# Patient Record
Sex: Male | Born: 1965 | Race: Black or African American | Hispanic: No
Health system: Southern US, Community
[De-identification: ages and names within clinical notes are randomized; demographics above are authoritative.]

## PROBLEM LIST (undated history)

## (undated) DIAGNOSIS — E119 Type 2 diabetes mellitus without complications: Secondary | ICD-10-CM

## (undated) DIAGNOSIS — I10 Essential (primary) hypertension: Secondary | ICD-10-CM

## (undated) DIAGNOSIS — I639 Cerebral infarction, unspecified: Secondary | ICD-10-CM

## (undated) DIAGNOSIS — G629 Polyneuropathy, unspecified: Secondary | ICD-10-CM

---

## 2014-12-28 DIAGNOSIS — I10 Essential (primary) hypertension: Secondary | ICD-10-CM | POA: Insufficient documentation

## 2014-12-28 DIAGNOSIS — G43809 Other migraine, not intractable, without status migrainosus: Secondary | ICD-10-CM | POA: Diagnosis not present

## 2014-12-28 DIAGNOSIS — Z8673 Personal history of transient ischemic attack (TIA), and cerebral infarction without residual deficits: Secondary | ICD-10-CM | POA: Insufficient documentation

## 2014-12-28 DIAGNOSIS — R42 Dizziness and giddiness: Secondary | ICD-10-CM | POA: Insufficient documentation

## 2014-12-28 DIAGNOSIS — E119 Type 2 diabetes mellitus without complications: Secondary | ICD-10-CM | POA: Diagnosis not present

## 2014-12-29 ENCOUNTER — Emergency Department (HOSPITAL_COMMUNITY): Payer: BLUE CROSS/BLUE SHIELD

## 2014-12-29 ENCOUNTER — Emergency Department (HOSPITAL_COMMUNITY)
Admission: EM | Admit: 2014-12-29 | Discharge: 2014-12-29 | Disposition: A | Discharge status: Home / Self Care | Payer: BLUE CROSS/BLUE SHIELD | Attending: Emergency Medicine | Admitting: Emergency Medicine

## 2014-12-29 ENCOUNTER — Encounter (HOSPITAL_COMMUNITY): Payer: Self-pay | Admitting: Emergency Medicine

## 2014-12-29 DIAGNOSIS — R51 Headache: Secondary | ICD-10-CM

## 2014-12-29 DIAGNOSIS — R42 Dizziness and giddiness: Secondary | ICD-10-CM

## 2014-12-29 DIAGNOSIS — G43809 Other migraine, not intractable, without status migrainosus: Secondary | ICD-10-CM

## 2014-12-29 HISTORY — DX: Essential (primary) hypertension: I10

## 2014-12-29 HISTORY — DX: Type 2 diabetes mellitus without complications: E11.9

## 2014-12-29 HISTORY — DX: Cerebral infarction, unspecified: I63.9

## 2014-12-29 HISTORY — DX: Polyneuropathy, unspecified: G62.9

## 2014-12-29 LAB — COMPREHENSIVE METABOLIC PANEL
ALT: 39 U/L (ref 0–53)
AST: 32 U/L (ref 0–37)
Albumin: 4.2 g/dL (ref 3.5–5.2)
Alkaline Phosphatase: 57 U/L (ref 39–117)
Anion gap: 10 (ref 5–15)
BILIRUBIN TOTAL: 0.5 mg/dL (ref 0.3–1.2)
BUN: 15 mg/dL (ref 6–23)
CALCIUM: 9.3 mg/dL (ref 8.4–10.5)
CO2: 21 mmol/L (ref 19–32)
Chloride: 103 mmol/L (ref 96–112)
Creatinine, Ser: 1.27 mg/dL (ref 0.50–1.35)
GFR calc Af Amer: 77 mL/min — ABNORMAL LOW (ref >90)
GFR, EST NON AFRICAN AMERICAN: 66 mL/min — AB (ref >90)
GLUCOSE: 128 mg/dL — AB (ref 70–99)
Potassium: 3.6 mmol/L (ref 3.5–5.1)
Sodium: 134 mmol/L — ABNORMAL LOW (ref 135–145)
TOTAL PROTEIN: 9 g/dL — AB (ref 6.0–8.3)

## 2014-12-29 LAB — DIFFERENTIAL
BASOS PCT: 0 % (ref 0–1)
Basophils Absolute: 0 10*3/uL (ref 0.0–0.1)
Eosinophils Absolute: 0.1 10*3/uL (ref 0.0–0.7)
Eosinophils Relative: 1 % (ref 0–5)
Lymphocytes Relative: 37 % (ref 12–46)
Lymphs Abs: 2.8 10*3/uL (ref 0.7–4.0)
MONOS PCT: 8 % (ref 3–12)
Monocytes Absolute: 0.6 10*3/uL (ref 0.1–1.0)
NEUTROS ABS: 4.1 10*3/uL (ref 1.7–7.7)
Neutrophils Relative %: 54 % (ref 43–77)

## 2014-12-29 LAB — URINALYSIS, ROUTINE W REFLEX MICROSCOPIC
Bilirubin Urine: NEGATIVE
Glucose, UA: NEGATIVE mg/dL
Hgb urine dipstick: NEGATIVE
KETONES UR: NEGATIVE mg/dL
LEUKOCYTES UA: NEGATIVE
NITRITE: NEGATIVE
PROTEIN: NEGATIVE mg/dL
Specific Gravity, Urine: 1.015 (ref 1.005–1.030)
Urobilinogen, UA: 0.2 mg/dL (ref 0.0–1.0)
pH: 5 (ref 5.0–8.0)

## 2014-12-29 LAB — CBC
HEMATOCRIT: 45.6 % (ref 39.0–52.0)
Hemoglobin: 16.1 g/dL (ref 13.0–17.0)
MCH: 31.3 pg (ref 26.0–34.0)
MCHC: 35.3 g/dL (ref 30.0–36.0)
MCV: 88.5 fL (ref 78.0–100.0)
PLATELETS: 327 10*3/uL (ref 150–400)
RBC: 5.15 MIL/uL (ref 4.22–5.81)
RDW: 12.5 % (ref 11.5–15.5)
WBC: 7.6 10*3/uL (ref 4.0–10.5)

## 2014-12-29 LAB — I-STAT CHEM 8, ED
BUN: 19 mg/dL (ref 6–23)
CHLORIDE: 104 mmol/L (ref 96–112)
Calcium, Ion: 1.16 mmol/L (ref 1.12–1.23)
Creatinine, Ser: 1.3 mg/dL (ref 0.50–1.35)
Glucose, Bld: 130 mg/dL — ABNORMAL HIGH (ref 70–99)
HCT: 52 % (ref 39.0–52.0)
Hemoglobin: 17.7 g/dL — ABNORMAL HIGH (ref 13.0–17.0)
POTASSIUM: 3.6 mmol/L (ref 3.5–5.1)
SODIUM: 140 mmol/L (ref 135–145)
TCO2: 18 mmol/L (ref 0–100)

## 2014-12-29 LAB — PROTIME-INR
INR: 0.99 (ref 0.00–1.49)
Prothrombin Time: 13.2 seconds (ref 11.6–15.2)

## 2014-12-29 LAB — RAPID URINE DRUG SCREEN, HOSP PERFORMED
Amphetamines: NOT DETECTED
Barbiturates: NOT DETECTED
Benzodiazepines: NOT DETECTED
Cocaine: NOT DETECTED
OPIATES: NOT DETECTED
Tetrahydrocannabinol: NOT DETECTED

## 2014-12-29 LAB — APTT: aPTT: 32 seconds (ref 24–37)

## 2014-12-29 LAB — I-STAT TROPONIN, ED: TROPONIN I, POC: 0 ng/mL (ref 0.00–0.08)

## 2014-12-29 LAB — ETHANOL: Alcohol, Ethyl (B): <5 mg/dL (ref 0–9)

## 2014-12-29 MED ORDER — KETOROLAC TROMETHAMINE 30 MG/ML IJ SOLN
30.0000 mg | Freq: Once | INTRAMUSCULAR | Status: AC
  Administered 2014-12-29: 30 mg via INTRAVENOUS
  Filled 2014-12-29: qty 1

## 2014-12-29 MED ORDER — METOCLOPRAMIDE HCL 10 MG PO TABS
10.0000 mg | ORAL_TABLET | Freq: Once | ORAL | Status: AC
  Administered 2014-12-29: 10 mg via ORAL
  Filled 2014-12-29: qty 1

## 2014-12-29 MED ORDER — DIPHENHYDRAMINE HCL 25 MG PO CAPS
50.0000 mg | ORAL_CAPSULE | Freq: Once | ORAL | Status: AC
  Administered 2014-12-29: 50 mg via ORAL
  Filled 2014-12-29: qty 2

## 2014-12-29 NOTE — ED Notes (Signed)
Pt to MRI

## 2014-12-29 NOTE — ED Notes (Signed)
Dr. Oni at bedside. 

## 2014-12-29 NOTE — ED Provider Notes (Signed)
CSN: [id number]     Arrival date & time 12/28/14  2355 History  This chart was scribed for Ernest Crumble, MD by Annye Asa, ED Scribe. This patient was seen in room B19C/B19C and the patient's care was started at 12:34 AM.    Chief Complaint  Patient presents with  . Dizziness   The history is provided by the patient. No language interpreter was used.     HPI Comments: Ernest Hammond is a 49 y.o. male with past medical history of stroke (mild, in 2009), DM, HTN who presents to the Emergency Department complaining of sudden onset blurred vision and dizziness while driving at 95:62 tonight. He describes a "sharp pain" in his upper arms, with a "tiredness, stiffness" in his muscles, along with SOB and "racing heart." He reports that the relative he spoke to on the phone at the time of symptom onset described his speech as "like he was drunk." Patient explains that he pulled over to "stabilize a bit," before driving himself to the ED. He still complains of dizziness at this time. He reports a recent history of "fainting spells," and tonight's symptoms felt similar to those as well as his previous stroke. He does note numbness in his lower extremities but reports some numbness to the area at baseline due to "neuropathy." He denies facial numbness.   Past Medical History  Diagnosis Date  . Stroke   . Diabetes mellitus without complication   . Hypertension   . Neuropathy    History reviewed. No pertinent past surgical history. No family history on file. History  Substance Use Topics  . Smoking status: Not on file  . Smokeless tobacco: Not on file  . Alcohol Use: Not on file    Review of Systems  A complete 10 system review of systems was obtained and all systems are negative except as noted in the HPI and PMH.    Allergies  Review of patient's allergies indicates no known allergies.  Home Medications   Prior to Admission medications   Not on File   BP 160/101 mmHg  Pulse 96   Temp(Src) 98.4 F (36.9 C) (Oral)  Resp 18  Ht  (1.778 m)  Wt 241 lb (109.317 kg)  BMI 34.58 kg/m2  SpO2 98% Physical Exam  Constitutional: He is oriented to person, place, and time. Vital signs are normal. He appears well-developed and well-nourished.  Non-toxic appearance. He does not appear ill. No distress.  HENT:  Head: Normocephalic and atraumatic.  Nose: Nose normal.  Mouth/Throat: Oropharynx is clear and moist. No oropharyngeal exudate.  Eyes: Conjunctivae and EOM are normal. Pupils are equal, round, and reactive to light. No scleral icterus.  Neck: Normal range of motion. Neck supple. No tracheal deviation, no edema, no erythema and normal range of motion present. No thyroid mass and no thyromegaly present.  Cardiovascular: Normal rate, regular rhythm, S1 normal, S2 normal, normal heart sounds, intact distal pulses and normal pulses.  Exam reveals no gallop and no friction rub.   No murmur heard. Pulses:      Radial pulses are 2+ on the right side, and 2+ on the left side.       Dorsalis pedis pulses are 2+ on the right side, and 2+ on the left side.  Pulmonary/Chest: Effort normal and breath sounds normal. No respiratory distress. He has no wheezes. He has no rhonchi. He has no rales.  Abdominal: Soft. Normal appearance and bowel sounds are normal. He exhibits no distension, no  ascites and no mass. There is no hepatosplenomegaly. There is no tenderness. There is no rebound, no guarding and no CVA tenderness.  Musculoskeletal: Normal range of motion. He exhibits no edema or tenderness.  Lymphadenopathy:    He has no cervical adenopathy.  Neurological: He is alert and oriented to person, place, and time. He has normal strength. No cranial nerve deficit or sensory deficit.  Decreased sensation in BLEs  Skin: Skin is warm, dry and intact. No petechiae and no rash noted. He is not diaphoretic. No erythema. No pallor.  Psychiatric: He has a normal mood and affect. His behavior  is normal. Judgment normal.  Nursing note and vitals reviewed.   ED Course  Procedures   DIAGNOSTIC STUDIES: Oxygen Saturation is 98% on RA, normal by my interpretation.    COORDINATION OF CARE: 12:42 AM Discussed treatment plan with pt at bedside and pt agreed to plan.  Labs Review Labs Reviewed  COMPREHENSIVE METABOLIC PANEL - Abnormal; Notable for the following:    Sodium 134 (*)    Glucose, Bld 128 (*)    Total Protein 9.0 (*)    GFR calc non Af Amer 66 (*)    GFR calc Af Amer 77 (*)    All other components within normal limits  I-STAT CHEM 8, ED - Abnormal; Notable for the following:    Glucose, Bld 130 (*)    Hemoglobin 17.7 (*)    All other components within normal limits  PROTIME-INR  APTT  CBC  DIFFERENTIAL  ETHANOL  URINE RAPID DRUG SCREEN (HOSP PERFORMED)  URINALYSIS, ROUTINE W REFLEX MICROSCOPIC  CBG MONITORING, ED  I-STAT TROPOININ, ED   Imaging Review Ct Head Hammond Contrast  12/29/2014   CLINICAL DATA:  Acute onset of generalized bilateral arm and leg weakness, headaches and dizziness. Initial encounter.  EXAM: CT HEAD WITHOUT CONTRAST  TECHNIQUE: Contiguous axial images were obtained from the base of the skull through the vertex without intravenous contrast.  COMPARISON:  None.  FINDINGS: There is no evidence of acute infarction, mass lesion, or intra- or extra-axial hemorrhage on CT.  The posterior fossa, including the cerebellum, brainstem and fourth ventricle, is within normal limits. The third and lateral ventricles, and basal ganglia are unremarkable in appearance. The cerebral hemispheres are symmetric in appearance, with normal gray-white differentiation. No mass effect or midline shift is seen.  There is no evidence of fracture; visualized osseous structures are unremarkable in appearance. The visualized portions of the orbits are within normal limits. The paranasal sinuses and mastoid air cells are well-aerated. No significant soft tissue abnormalities are  seen.  IMPRESSION: Unremarkable noncontrast CT of the head.   Electronically Signed   By: Roanna Raider M.D.   On: 12/29/2014 01:40   Ernest Hammond Contrast  12/29/2014   CLINICAL DATA:  Acute onset dizziness, blurry vision, headache and slurred speech. History of stroke 2009, diabetes, hypertension and migraine.  EXAM: MRI HEAD WITHOUT CONTRAST  TECHNIQUE: Multiplanar, multiecho pulse sequences of the brain and surrounding structures were obtained without intravenous contrast.  COMPARISON:  CT of the head December 29, 2014 at 1:01 a.m.  FINDINGS: The ventricles and sulci are normal for patient's age. No abnormal parenchymal signal, mass lesions, mass effect. No reduced diffusion to suggest acute ischemia. No susceptibility artifact to suggest hemorrhage. A few scattered subcentimeter supratentorial white matter FLAIR T2 hyperintensities.  No abnormal extra-axial fluid collections. No extra-axial masses though, contrast enhanced sequences would be more sensitive. Normal major intracranial vascular flow  voids seen at the skull base.  Ocular globes and orbital contents are unremarkable though not tailored for evaluation. No abnormal sellar expansion. Visualized paranasal sinuses and mastoid air cells are well-aerated. No suspicious calvarial bone marrow signal. No abnormal sellar expansion. Craniocervical junction maintained.  IMPRESSION: No acute intracranial process, specifically no acute ischemia.  Minimal white matter changes, may reflect chronic small vessel ischemic disease or migraine disorder.   Electronically Signed   By: Awilda Metroourtnay  Bloomer   On: 12/29/2014 03:10     EKG Interpretation   Date/Time:  Monday December 29 2014 01:01:09 EST Ventricular Rate:  94 PR Interval:  162 QRS Duration: 79 QT Interval:  333 QTC Calculation: 416 R Axis:   85 Text Interpretation:  Sinus rhythm T wave inversion Inferior leads  Confirmed by Erroll Lunani, Carrye Goller Ayokunle [phone] on 12/29/2014 2:41:22 AM      MDM    Final diagnoses:  None   Patient presents to the emergency department for dizziness. He states this is consistent with his prior stroke. MRI was obtained as well as neurology consult. MRI does not show any acute CVA. Per neurology note, the patient will be safe for discharge at this time and we will treat as migraine headache. He was given Toradol Reglan and Benadryl for relief. Neurology follow-up will be provided for him. EKG does show T-wave inversions in the inferior leads, I have no prior EKG to compare this with. Patient has no chest pain or shortness of breath and a negative troponin. I do not believe his dizziness is related to ACS. His vital signs remain within his normal limits and he is safe for discharge.    I personally performed the services described in this documentation, which was scribed in my presence. The recorded information has been reviewed and is accurate.      Ernest CrumbleAdeleke Shlome Baldree, MD 12/29/14 [phone]

## 2014-12-29 NOTE — Discharge Instructions (Signed)
Dizziness Mr. Ernest Hammond, you MRI did not show any stroke. Her dizziness may be related to your headache. Continue to take Motrin at home as needed and follow-up with neurology for continued management within 3 days. If symptoms worsen come back to the emergency department and medially. Thank you.  Dizziness means you feel unsteady or lightheaded. You might feel like you are going to pass out (faint). HOME CARE   Drink enough fluids to keep your pee (urine) clear or pale yellow.  Take your medicines exactly as told by your doctor. If you take blood pressure medicine, always stand up slowly from the lying or sitting position. Hold on to something to steady yourself.  If you need to stand in one place for a long time, move your legs often. Tighten and relax your leg muscles.  Have someone stay with you until you feel okay.  Do not drive or use heavy machinery if you feel dizzy.  Do not drink alcohol. GET HELP RIGHT AWAY IF:   You feel dizzy or lightheaded and it gets worse.  You feel sick to your stomach (nauseous), or you throw up (vomit).  You have trouble talking or walking.  You feel weak or have trouble using your arms, hands, or legs.  You cannot think clearly or have trouble forming sentences.  You have chest pain, belly (abdominal) pain, sweating, or you are short of breath.  Your vision changes.  You are bleeding.  You have problems from your medicine that seem to be getting worse. MAKE SURE YOU:   Understand these instructions.  Will watch your condition.  Will get help right away if you are not doing well or get worse. Document Released: 10/06/2011 Document Revised: 01/09/2012 Document Reviewed: 10/06/2011 [facility] Patient Information 2015 [employer] [place]. This information is not intended to replace advice given to you by your health care provider. Make sure you discuss any questions you have with your health care provider.

## 2014-12-29 NOTE — ED Notes (Signed)
Pt reports he has been recently Dx with complicated migraines. States these symptoms tonight are similar to previous migraines he has had in the past.

## 2014-12-29 NOTE — ED Notes (Signed)
This RN spoke to Dr. Preston FleetingGlick about patient's symptoms, Dr. Preston FleetingGlick stated he did not want to call Code Stroke at this time, patient next in line to receive treatment room.

## 2014-12-29 NOTE — ED Notes (Signed)
Per Patient: [place] when [place] suddenly started having blurred vision and dizziness. Reports previous mild stroke in 2009. Currently Ax4, NAD. Reports symptoms went away but are starting to come back.

## 2014-12-29 NOTE — Consult Note (Signed)
Referring Physician: ED    Chief Complaint: dizziness, blurred vision, HA, slurred speech  HPI:                                                                                                                                         Ernest Hammond is an 49 y.o. male with past medical history of DM, HTN, ischemic stroke in 2009 without residual deficits, depression, panic attacks, and migraine, who presents to the Emergency Department complaining of sudden onset blurred vision and dizziness while driving around 16:10 tonight. He said that he was driving and suddenly developed blurred vision, HA, nausea, a sensation of dizziness " like everything bouncing, jumping", burning and tiredness of both arms, disoriented. He decided to  pulled over to "stabilize a bit," before driving himself to the ED. Denies associated double vision, difficulty swallowing, focal weakness or numbness. CT brain was personally reviewed and showed no acute abnormality. Available serologies are unremarkable. Stated that at this moment his HA is getting stronger. Of note, he reports prior migraine with " speech changes". Endorses significant stress in the past few days. Date last known well: 12/28/14 Time last known well: 23:30 tPA Given: no, NIHSS 0  Past Medical History  Diagnosis Date  . Stroke   . Diabetes mellitus without complication   . Hypertension   . Neuropathy     History reviewed. No pertinent past surgical history.  No family history on file. Social History:  has no tobacco, alcohol, and drug history on file.  Allergies: No Known Allergies  Medications:                                                                                                                           I have reviewed the patient's current medications.  ROS:  History obtained from the patient and chart  review  General ROS: negative for - chills, fatigue, fever, night sweats, weight gain or weight loss Psychological ROS: negative for - behavioral disorder, hallucinations, memory difficulties, mood swings or suicidal ideation Ophthalmic ROS: negative for - blurry vision, double vision, eye pain or loss of vision ENT ROS: negative for - epistaxis, nasal discharge, oral lesions, sore throat, tinnitus or vertigo Allergy and Immunology ROS: negative for - hives or itchy/watery eyes Hematological and Lymphatic ROS: negative for - bleeding problems, bruising or swollen lymph nodes Endocrine ROS: negative for - galactorrhea, hair pattern changes, polydipsia/polyuria or temperature intolerance Respiratory ROS: negative for - cough, hemoptysis, shortness of breath or wheezing Cardiovascular ROS: negative for - chest pain, dyspnea on exertion, edema or irregular heartbeat Gastrointestinal ROS: negative for - abdominal pain, diarrhea, hematemesis, nausea/vomiting or stool incontinence Genito-Urinary ROS: negative for - dysuria, hematuria, incontinence or urinary frequency/urgency Musculoskeletal ROS: negative for - joint swelling or muscular weakness Neurological ROS: as noted in HPI Dermatological ROS: negative for rash and skin lesion changes   Physical exam: pleasant male in no apparent distress. Blood pressure 160/101, pulse 96, temperature 98.4 F (36.9 C), temperature source Oral, resp. rate 18, height  (1.778 m), weight 109.317 kg (241 lb), SpO2 98 %. Head: normocephalic. Neck: supple, no bruits, no JVD. Cardiac: no murmurs. Lungs: clear. Abdomen: soft, no tender, no mass. Extremities: no edema. Skin: no rash  Neurologic Examination:                                                                                                      General: Mental Status: Alert, oriented, thought content appropriate.  Speech fluent without evidence of aphasia.  Able to follow 3 step commands without  difficulty. Cranial Nerves: II: Discs flat bilaterally; Visual fields grossly normal, pupils equal, round, reactive to light and accommodation III,IV, VI: ptosis not present, extra-ocular motions intact bilaterally V,VII: smile symmetric, facial light touch sensation normal bilaterally VIII: hearing normal bilaterally IX,X: gag reflex present XI: bilateral shoulder shrug XII: midline tongue extension without atrophy or fasciculations Motor: Right : Upper extremity   5/5    Left:     Upper extremity   5/5  Lower extremity   5/5     Lower extremity   5/5 Tone and bulk:normal tone throughout; no atrophy noted Sensory: Pinprick and light touch intact throughout, bilaterally Deep Tendon Reflexes:  Right: Upper Extremity   Left: Upper extremity   biceps (C-5 to C-6) 2/4   biceps (C-5 to C-6) 2/4 tricep (C7) 2/4    triceps (C7) 2/4 Brachioradialis (C6) 2/4  Brachioradialis (C6) 2/4  Lower Extremity Lower Extremity  quadriceps (L-2 to L-4) 2/4   quadriceps (L-2 to L-4) 2/4 Achilles (S1) 2/4   Achilles (S1) 2/4  Plantars: Right: downgoing   Left: downgoing Cerebellar: normal finger-to-nose,  normal heel-to-shin test Gait:  No tested for safety reasons    Results for orders placed or performed during the hospital encounter of 12/29/14 (from the past 48 hour(s))  CBC  Status: None   Collection Time: 12/29/14 12:19 AM  Result Value Ref Range   WBC 7.6 4.0 - 10.5 K/uL   RBC 5.15 4.22 - 5.81 MIL/uL   Hemoglobin 16.1 13.0 - 17.0 g/dL   HCT 16.145.6 09.639.0 - 04.552.0 %   MCV 88.5 78.0 - 100.0 fL   MCH 31.3 26.0 - 34.0 pg   MCHC 35.3 30.0 - 36.0 g/dL   RDW 40.912.5 81.111.5 - 91.415.5 %   Platelets 327 150 - 400 K/uL  Differential     Status: None   Collection Time: 12/29/14 12:19 AM  Result Value Ref Range   Neutrophils Relative % 54 43 - 77 %   Neutro Abs 4.1 1.7 - 7.7 K/uL   Lymphocytes Relative 37 12 - 46 %   Lymphs Abs 2.8 0.7 - 4.0 K/uL   Monocytes Relative 8 3 - 12 %   Monocytes Absolute  0.6 0.1 - 1.0 K/uL   Eosinophils Relative 1 0 - 5 %   Eosinophils Absolute 0.1 0.0 - 0.7 K/uL   Basophils Relative 0 0 - 1 %   Basophils Absolute 0.0 0.0 - 0.1 K/uL  I-stat troponin, ED ([facility])     Status: None   Collection Time: 12/29/14 12:34 AM  Result Value Ref Range   Troponin i, poc 0.00 0.00 - 0.08 ng/mL   Comment 3            Comment: Due to the release kinetics of cTnI, a negative result within the first hours of the onset of symptoms does not rule out myocardial infarction with certainty. If myocardial infarction is still suspected, repeat the test at appropriate intervals.   I-Stat Chem 8, ED     Status: Abnormal   Collection Time: 12/29/14 12:35 AM  Result Value Ref Range   Sodium 140 135 - 145 mmol/L   Potassium 3.6 3.5 - 5.1 mmol/L   Chloride 104 96 - 112 mmol/L   BUN 19 6 - 23 mg/dL   Creatinine, Ser 7.821.30 0.50 - 1.35 mg/dL   Glucose, Bld 956130 (H) 70 - 99 mg/dL   Calcium, Ion 2.131.16 0.861.12 - 1.23 mmol/L   TCO2 18 0 - 100 mmol/L   Hemoglobin 17.7 (H) 13.0 - 17.0 g/dL   HCT 57.852.0 46.939.0 - 62.952.0 %   No results found.     Assessment: 49 y.o. male with past medical history of DM, HTN, ischemic stroke in 2009 without residual deficits, depression, panic attacks, and migraine, presents to the ED with acute onset dizziness, blurred vision, HA, nausea, slurred speech. Neuro-exam is non focal and CT brain is unrevealing. ? Complicated migraine, ? panic attack. However, patient has risk factors for stroke and obtaining MRI brain to ruled out posterior circulation infarct is prudent. Further work up if MRI positive for stroke. Migraine management as per ED attending.  Stroke Risk Factors -  DM, HTN, prior stroke  Wyatt Portelasvaldo Camilo, MD [facility] [phone]  12/29/2014, 12:52 AM

## 2016-01-11 IMAGING — MR MR HEAD W/O CM
9 of 11 series · 34 of 48 positions shown · non-contrast
Comparison: CT of the head December 29, 2014 at [DATE] a.m.

CLINICAL DATA: Acute onset dizziness, blurry vision, headache and
slurred speech. History of stroke 9550, diabetes, hypertension and
migraine.

EXAM:
MRI HEAD WITHOUT CONTRAST
TECHNIQUE: Multiplanar, multiecho pulse sequences of the brain and surrounding
structures were obtained without intravenous contrast.

[Series 2: FLAIR · sagittal · 5.0mm · 0.47mm/px · 1 of 25 slices shown (1 of 2)]
[im 1/25]
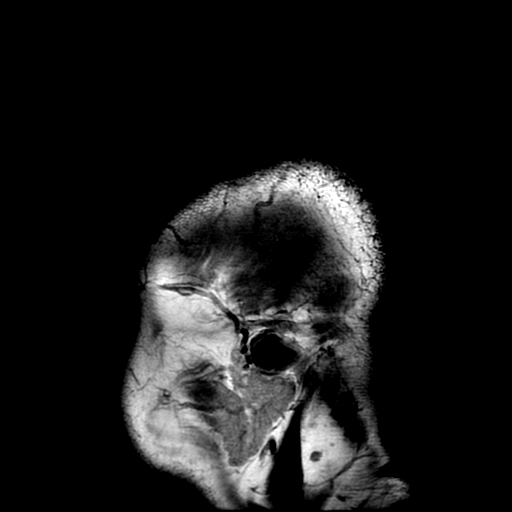

[Series 4: DWI · axial · 3.0mm · 0.94mm/px · z∈[-38,+99]mm · 9 of 94 slices shown (1 of 4)]
[im 1/94]
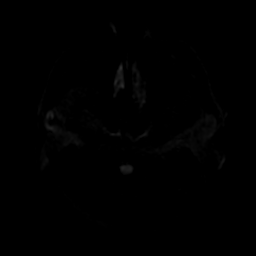
[im 12/94]
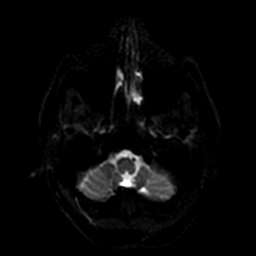
[im 24/94]
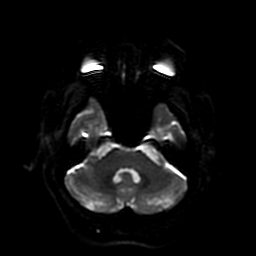
[im 35/94]
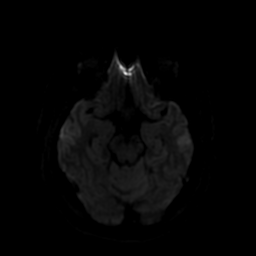
[im 47/94]
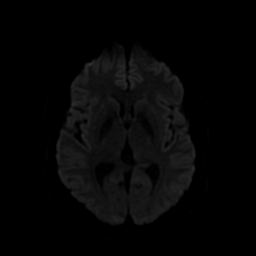
[im 59/94]
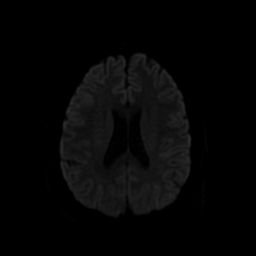
[im 70/94]
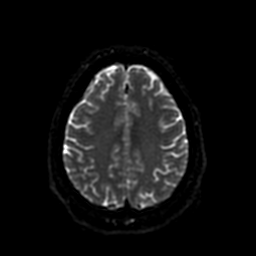
[im 82/94]
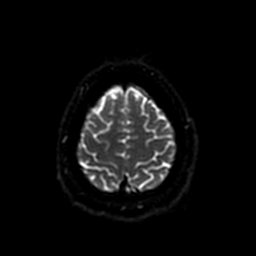
[im 94/94]
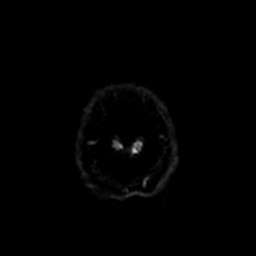

[Series 5: T2 · axial · 5.0mm · 0.47mm/px · z∈[-38,+99]mm · 2 of 24 slices shown (1 of 2)]
[im 1/24]
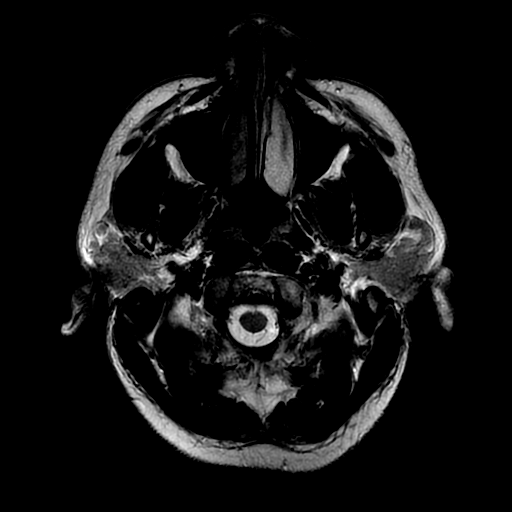
[im 24/24]
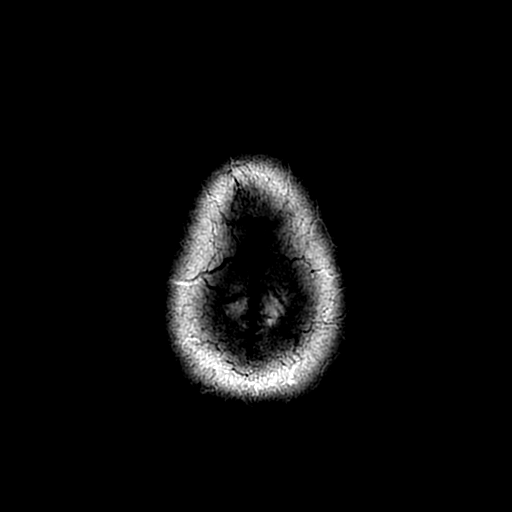

[Series 6: FLAIR · axial · 5.0mm · 0.47mm/px · z∈[-38,+99]mm · 2 of 24 slices shown (2 of 2)]
[im 1/24]
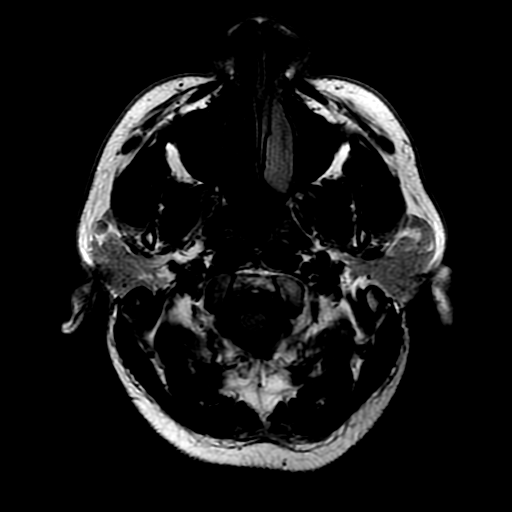
[im 24/24]
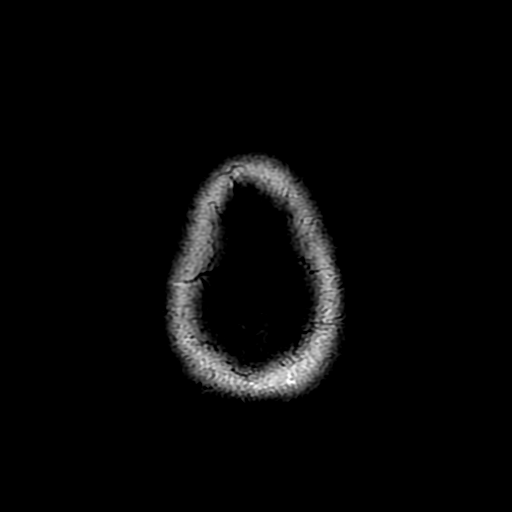

[Series 7: DWI · coronal · 5.0mm · 0.94mm/px · 6 of 66 slices shown (2 of 4)]
[im 1/66]
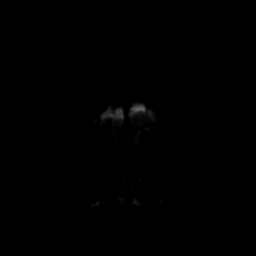
[im 14/66]
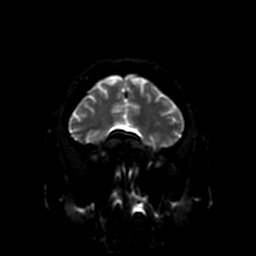
[im 27/66]
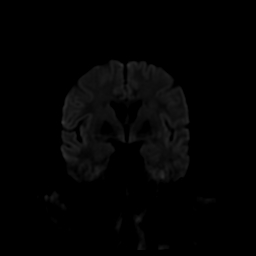
[im 40/66]
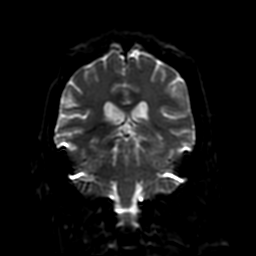
[im 53/66]
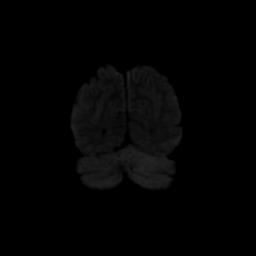
[im 66/66]
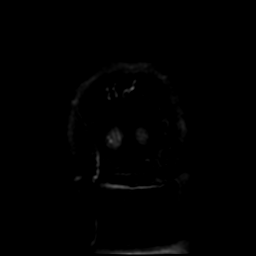

[Series 8: (person_name) · axial · 3.0mm · 0.47mm/px · z∈[-39,+13]mm · 4 of 96 slices shown]
[im 1/96]
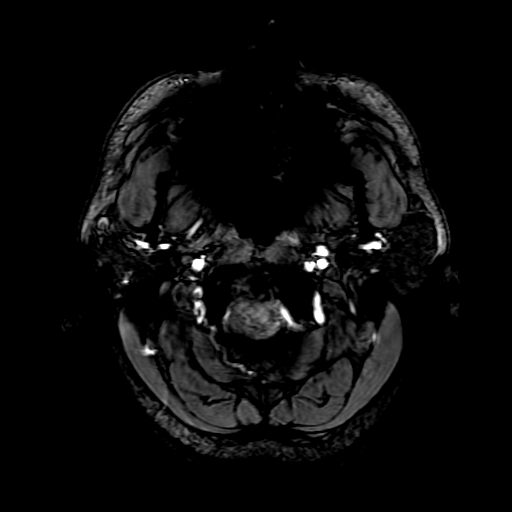
[im 12/96]
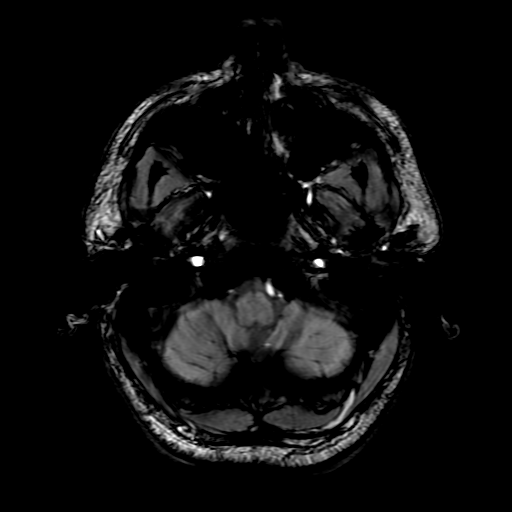
[im 24/96]
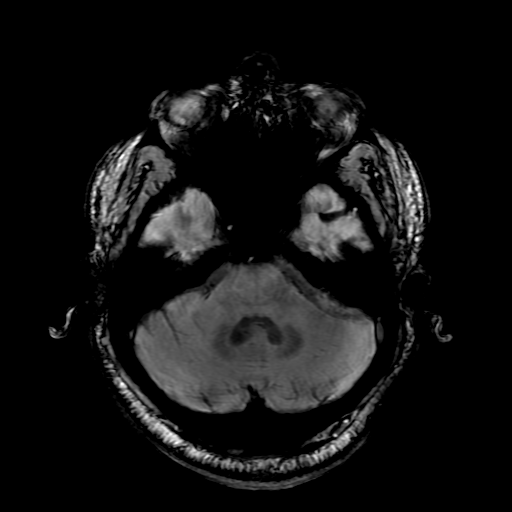
[im 36/96]
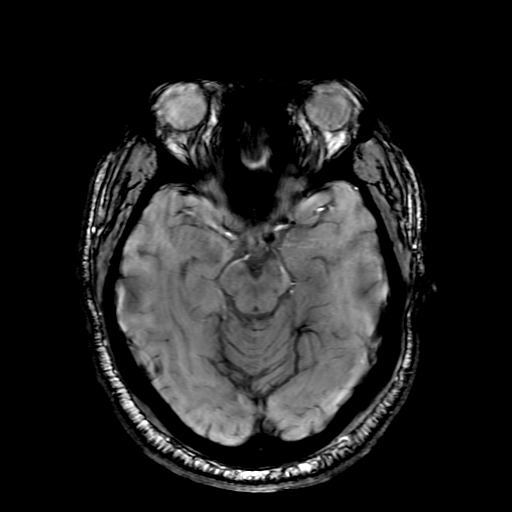

[Series 10: T2 · coronal · 5.0mm · 0.39mm/px · 3 of 28 slices shown (2 of 2)]
[im 1/28]
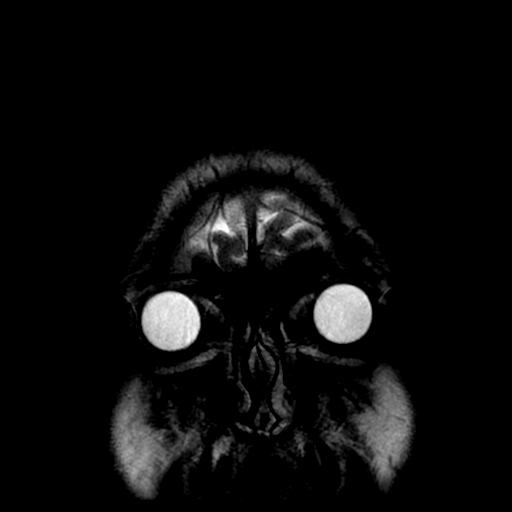
[im 14/28]
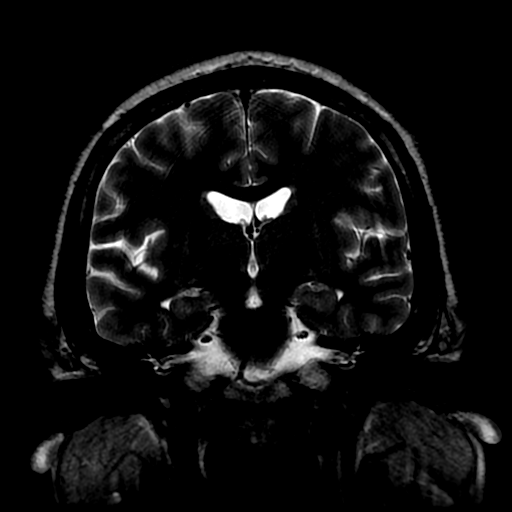
[im 28/28]
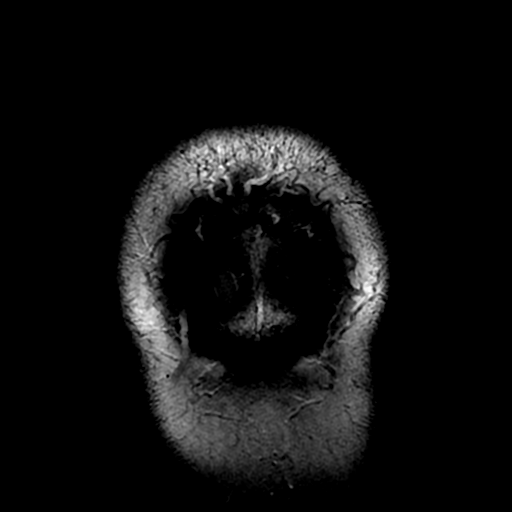

[Series 400: DWI · axial · 3.0mm · 0.94mm/px · z∈[-38,+99]mm · 4 of 47 slices shown (3 of 4)]
[im 1/47]
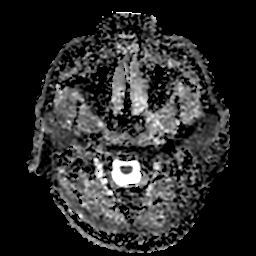
[im 16/47]
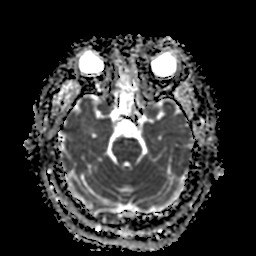
[im 31/47]
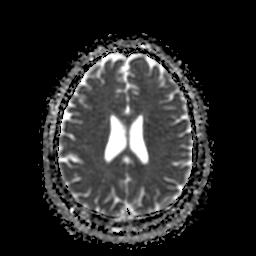
[im 47/47]
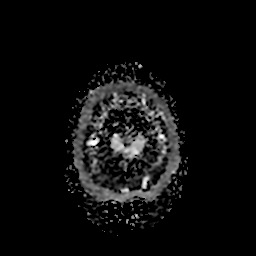

[Series 700: DWI · coronal · 5.0mm · 0.94mm/px · 3 of 33 slices shown (4 of 4)]
[im 1/33]
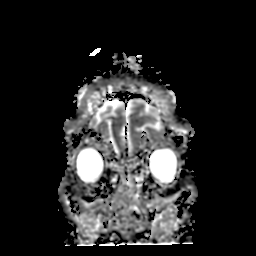
[im 17/33]
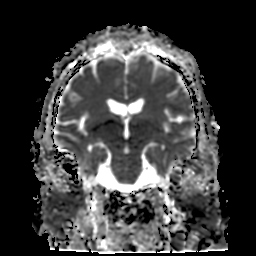
[im 33/33]
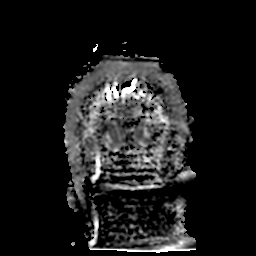

[34 of 48 positions shown; findings below may reference images not displayed]

FINDINGS: The ventricles and sulci are normal for patient's age. No abnormal
parenchymal signal, mass lesions, mass effect. No reduced diffusion
to suggest acute ischemia. No susceptibility artifact to suggest
hemorrhage. A few scattered subcentimeter supratentorial white
matter FLAIR T2 hyperintensities.

No abnormal extra-axial fluid collections. No extra-axial masses
though, contrast enhanced sequences would be more sensitive. Normal
major intracranial vascular flow voids seen at the skull base.

Ocular globes and orbital contents are unremarkable though not
tailored for evaluation. No abnormal sellar expansion. Visualized
paranasal sinuses and mastoid air cells are well-aerated. No
suspicious calvarial bone marrow signal. No abnormal sellar
expansion. Craniocervical junction maintained.
IMPRESSION: No acute intracranial process, specifically no acute ischemia.

Minimal white matter changes, may reflect chronic small vessel
ischemic disease or migraine disorder.

  By: Tememr Wagarru
